# Patient Record
Sex: Female | Born: 1985 | Race: Black or African American | Hispanic: No | Marital: Single | Smoking: Current every day smoker
Health system: Southern US, Community
[De-identification: ages and names within clinical notes are randomized; demographics above are authoritative.]

## PROBLEM LIST (undated history)

## (undated) DIAGNOSIS — Z86718 Personal history of other venous thrombosis and embolism: Secondary | ICD-10-CM

## (undated) HISTORY — PX: ABDOMINAL SURGERY: SHX537

## (undated) HISTORY — PX: CHEST TUBE INSERTION: SHX231

## (undated) HISTORY — PX: LUNG SURGERY: SHX703

---

## 2009-03-03 ENCOUNTER — Ambulatory Visit: Payer: Self-pay | Admitting: Cardiology

## 2009-07-02 ENCOUNTER — Ambulatory Visit (HOSPITAL_COMMUNITY): Admission: RE | Admit: 2009-07-02 | Discharge: 2009-07-02 | Payer: Self-pay | Admitting: Internal Medicine

## 2009-07-09 ENCOUNTER — Ambulatory Visit (HOSPITAL_COMMUNITY): Admission: RE | Admit: 2009-07-09 | Discharge: 2009-07-09 | Payer: Self-pay | Admitting: Internal Medicine

## 2009-07-20 ENCOUNTER — Ambulatory Visit (HOSPITAL_COMMUNITY): Admission: RE | Admit: 2009-07-20 | Discharge: 2009-07-20 | Payer: Self-pay | Admitting: Internal Medicine

## 2010-05-22 ENCOUNTER — Emergency Department (HOSPITAL_COMMUNITY)
Admission: EM | Admit: 2010-05-22 | Discharge: 2010-05-22 | Disposition: A | Discharge status: Home / Self Care | Payer: Medicaid Other | Attending: Emergency Medicine | Admitting: Emergency Medicine

## 2010-05-22 DIAGNOSIS — N390 Urinary tract infection, site not specified: Secondary | ICD-10-CM | POA: Insufficient documentation

## 2010-05-22 DIAGNOSIS — K5289 Other specified noninfective gastroenteritis and colitis: Secondary | ICD-10-CM | POA: Insufficient documentation

## 2010-05-22 DIAGNOSIS — F17200 Nicotine dependence, unspecified, uncomplicated: Secondary | ICD-10-CM | POA: Insufficient documentation

## 2010-05-22 LAB — URINALYSIS, ROUTINE W REFLEX MICROSCOPIC
Glucose, UA: NEGATIVE mg/dL
Nitrite: NEGATIVE
Specific Gravity, Urine: 1.01 (ref 1.005–1.030)

## 2010-05-22 LAB — URINE MICROSCOPIC-ADD ON

## 2010-05-25 LAB — URINE CULTURE

## 2011-01-23 ENCOUNTER — Emergency Department (HOSPITAL_COMMUNITY)
Admission: EM | Admit: 2011-01-23 | Discharge: 2011-01-23 | Disposition: A | Discharge status: Home / Self Care | Payer: Medicaid Other | Attending: Emergency Medicine | Admitting: Emergency Medicine

## 2011-01-23 DIAGNOSIS — F17200 Nicotine dependence, unspecified, uncomplicated: Secondary | ICD-10-CM | POA: Insufficient documentation

## 2011-01-23 DIAGNOSIS — J111 Influenza due to unidentified influenza virus with other respiratory manifestations: Secondary | ICD-10-CM | POA: Insufficient documentation

## 2011-01-23 MED ORDER — KETOROLAC TROMETHAMINE 60 MG/2ML IM SOLN
60.0000 mg | Freq: Once | INTRAMUSCULAR | Status: AC
  Administered 2011-01-23: 60 mg via INTRAMUSCULAR
  Filled 2011-01-23: qty 2

## 2011-01-23 MED ORDER — ACETAMINOPHEN 500 MG PO TABS
1000.0000 mg | ORAL_TABLET | Freq: Once | ORAL | Status: AC
  Administered 2011-01-23: 1000 mg via ORAL
  Filled 2011-01-23: qty 2

## 2011-01-23 MED ORDER — OSELTAMIVIR PHOSPHATE 75 MG PO CAPS: 75.0000 mg | ORAL_CAPSULE | Freq: Two times a day (BID) | ORAL | Status: AC

## 2011-01-23 NOTE — ED Notes (Signed)
Pt presents with cough, generalized body aches and headache since last night. NAD at this time.

## 2011-01-24 NOTE — ED Provider Notes (Signed)
History     CSN: [id number] Arrival date & time: 01/23/2011 12:50 PM   First MD Initiated Contact with Patient 01/23/11 1323      Chief Complaint  Patient presents with  . Generalized Body Aches  . Cough  . Headache    (Consider location/radiation/quality/duration/timing/severity/associated sxs/prior treatment) Patient is a 25 y.o. female presenting with cough and headaches. The history is provided by the patient.  Cough This is a new problem. The current episode started 12 to 24 hours ago. The problem occurs constantly. The problem has not changed since onset.The cough is non-productive. The maximum temperature recorded prior to her arrival was 101 to 101.9 F. The fever has been present for less than 1 day. Associated symptoms include chills, sweats, headaches and myalgias. Pertinent negatives include no chest pain, no sore throat and no shortness of breath. She has tried nothing for the symptoms. She is a smoker. Her past medical history does not include pneumonia or asthma.  Headache  Pertinent negatives include no fever, no shortness of breath and no nausea.    History reviewed. No pertinent past medical history.  History reviewed. No pertinent past surgical history.  No family history on file.  History  Substance Use Topics  . Smoking status: Current Everyday Smoker -- 0.5 packs/day  . Smokeless tobacco: Not on file  . Alcohol Use: Yes     occ    OB History    Grav Para Term Preterm Abortions TAB SAB Ect Mult Living                  Review of Systems  Constitutional: Positive for chills. Negative for fever.  HENT: Negative for congestion, sore throat and neck pain.   Eyes: Negative.   Respiratory: Positive for cough. Negative for chest tightness and shortness of breath.   Cardiovascular: Negative for chest pain.  Gastrointestinal: Negative for nausea and abdominal pain.  Genitourinary: Negative.   Musculoskeletal: Positive for myalgias. Negative for joint  swelling and arthralgias.  Skin: Negative.  Negative for rash and wound.  Neurological: Positive for headaches. Negative for dizziness, weakness, light-headedness and numbness.  Hematological: Negative.   Psychiatric/Behavioral: Negative.     Allergies  Other  Home Medications   Current Outpatient Rx  Name Route Sig Dispense Refill  . OSELTAMIVIR PHOSPHATE 75 MG PO CAPS Oral Take 1 capsule (75 mg total) by mouth 2 (two) times daily. 10 capsule 0    BP 112/56  Pulse 111  Temp(Src) 101.4 F (38.6 C) (Oral)  Resp 18  Ht 5\' 5"  (1.651 m)  Wt 150 lb (68.04 kg)  BMI 24.96 kg/m2  SpO2 98%  LMP 01/16/2011  Physical Exam  Nursing note and vitals reviewed. Constitutional: She is oriented to person, place, and time. She appears well-developed and well-nourished.       Appears uncomfortable.  HENT:  Head: Normocephalic and atraumatic.  Eyes: Conjunctivae are normal.  Neck: Normal range of motion.  Cardiovascular: Normal rate, regular rhythm, normal heart sounds and intact distal pulses.   Pulmonary/Chest: Effort normal and breath sounds normal. She has no wheezes. She has no rales. She exhibits no tenderness.  Abdominal: Soft. Bowel sounds are normal. There is no tenderness.  Musculoskeletal: Normal range of motion.  Neurological: She is alert and oriented to person, place, and time.  Skin: Skin is warm and dry.  Psychiatric: She has a normal mood and affect.    ED Course  Procedures (including critical care time)  Labs Reviewed -  No data to display No results found.   1. Influenza    Toradol 60mg  injection given for myalgia,  Tylenol for fever tx.   MDM  Influenza.  No acute distress.  Normal respiratory exam.  The patient appears reasonably screened and/or stabilized for discharge and I doubt any other medical condition or other [facility] requiring further screening, evaluation, or treatment in the ED at this time prior to discharge.         Candis Musa,  PA 01/24/11 2123

## 2011-01-25 NOTE — ED Provider Notes (Signed)
Medical screening examination/treatment/procedure(s) were performed by non-physician practitioner and as supervising physician I was immediately available for consultation/collaboration.   Shelda Jakes, MD 01/25/11 [phone]

## 2011-09-24 ENCOUNTER — Encounter (HOSPITAL_COMMUNITY): Payer: Self-pay | Admitting: *Deleted

## 2011-09-24 ENCOUNTER — Emergency Department (HOSPITAL_COMMUNITY): Payer: Self-pay

## 2011-09-24 ENCOUNTER — Emergency Department (HOSPITAL_COMMUNITY)
Admission: EM | Admit: 2011-09-24 | Discharge: 2011-09-24 | Disposition: A | Discharge status: Home / Self Care | Payer: Self-pay | Attending: Emergency Medicine | Admitting: Emergency Medicine

## 2011-09-24 DIAGNOSIS — IMO0002 Reserved for concepts with insufficient information to code with codable children: Secondary | ICD-10-CM | POA: Insufficient documentation

## 2011-09-24 DIAGNOSIS — M25539 Pain in unspecified wrist: Secondary | ICD-10-CM | POA: Insufficient documentation

## 2011-09-24 DIAGNOSIS — S41109A Unspecified open wound of unspecified upper arm, initial encounter: Secondary | ICD-10-CM | POA: Insufficient documentation

## 2011-09-24 MED ORDER — LIDOCAINE-EPINEPHRINE (PF) 1 %-1:200000 IJ SOLN
INTRAMUSCULAR | Status: AC
  Filled 2011-09-24: qty 10

## 2011-09-24 MED ORDER — TETANUS-DIPHTH-ACELL PERTUSSIS 5-2.5-18.5 LF-MCG/0.5 IM SUSP
0.5000 mL | Freq: Once | INTRAMUSCULAR | Status: AC
  Administered 2011-09-24: 0.5 mL via INTRAMUSCULAR
  Filled 2011-09-24: qty 0.5

## 2011-09-24 MED ORDER — IBUPROFEN 800 MG PO TABS
800.0000 mg | ORAL_TABLET | Freq: Once | ORAL | Status: AC
  Administered 2011-09-24: 800 mg via ORAL
  Filled 2011-09-24: qty 1
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
## 2011-09-24 NOTE — ED Notes (Signed)
Pt has laceration to R forearm; bleeding controlled; R arm sore and slightly swollen; Positive pulses ano no deficits with ROM noted

## 2014-03-23 IMAGING — CR DG FOREARM 2V*R*
1 series · 1 of 1 positions shown · non-contrast
Comparison: Contemporaneous wrist radiographs

CLINICAL DATA: Right anterior forearm laceration and pain

RIGHT FOREARM - 2 VIEW

[view not recorded]
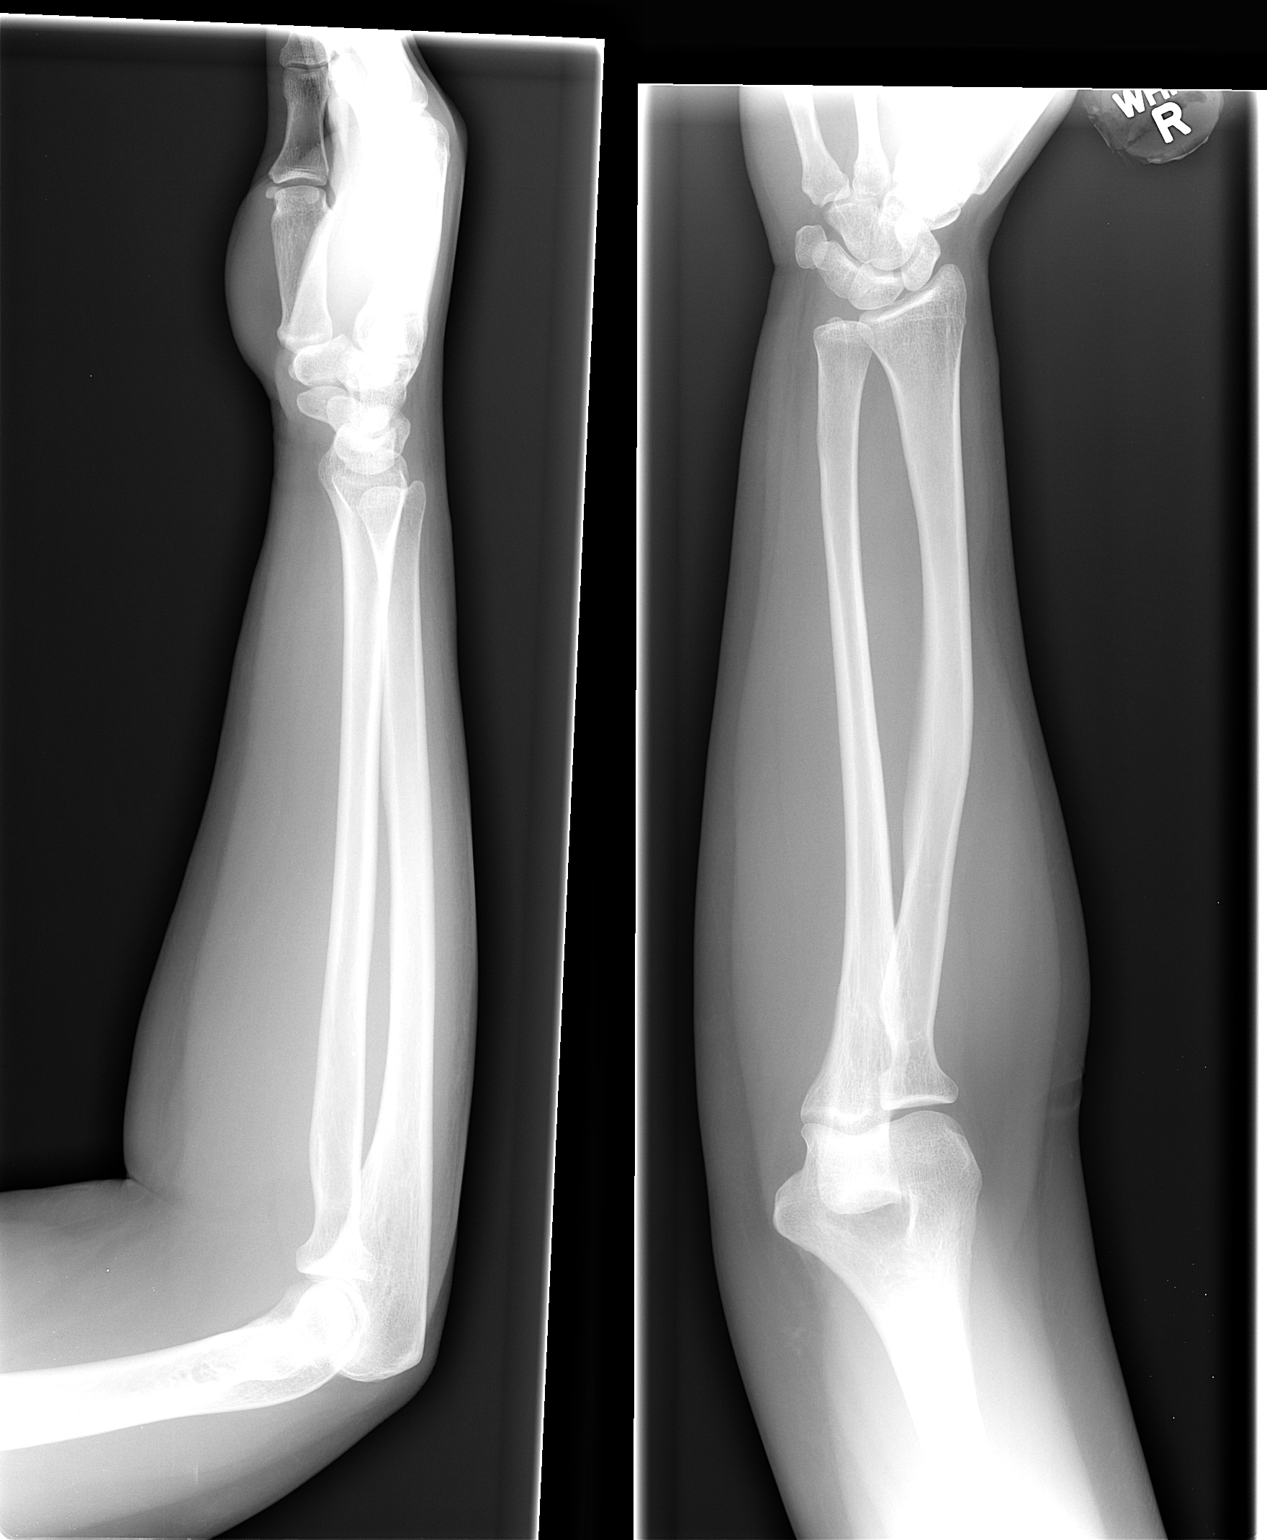

[1 of 1 positions shown; findings below may reference images not displayed]

FINDINGS: No displaced fracture or dislocation of the forearm.  No
radiopaque foreign body.
IMPRESSION: No acute osseous abnormality of the right forearm identified.

## 2014-05-27 ENCOUNTER — Other Ambulatory Visit (HOSPITAL_COMMUNITY): Payer: Self-pay | Admitting: Nurse Practitioner

## 2014-05-27 DIAGNOSIS — Z30431 Encounter for routine checking of intrauterine contraceptive device: Secondary | ICD-10-CM

## 2014-05-30 ENCOUNTER — Other Ambulatory Visit (HOSPITAL_COMMUNITY): Payer: Self-pay | Admitting: Nurse Practitioner

## 2014-05-30 DIAGNOSIS — Z30431 Encounter for routine checking of intrauterine contraceptive device: Secondary | ICD-10-CM

## 2014-06-04 ENCOUNTER — Ambulatory Visit (HOSPITAL_COMMUNITY): Admission: RE | Admit: 2014-06-04 | Payer: Medicaid Other | Source: Ambulatory Visit

## 2014-06-04 ENCOUNTER — Other Ambulatory Visit (HOSPITAL_COMMUNITY): Payer: Medicaid Other

## 2014-06-04 ENCOUNTER — Ambulatory Visit (HOSPITAL_COMMUNITY): Payer: Medicaid Other

## 2017-06-15 ENCOUNTER — Encounter (HOSPITAL_COMMUNITY): Payer: Self-pay | Admitting: *Deleted

## 2017-06-15 ENCOUNTER — Other Ambulatory Visit: Payer: Self-pay

## 2017-06-15 ENCOUNTER — Emergency Department (HOSPITAL_COMMUNITY)
Admission: EM | Admit: 2017-06-15 | Discharge: 2017-06-16 | Disposition: A | Discharge status: Home / Self Care | Payer: Self-pay | Attending: Emergency Medicine | Admitting: Emergency Medicine

## 2017-06-15 DIAGNOSIS — M791 Myalgia, unspecified site: Secondary | ICD-10-CM | POA: Insufficient documentation

## 2017-06-15 DIAGNOSIS — E876 Hypokalemia: Secondary | ICD-10-CM

## 2017-06-15 DIAGNOSIS — M6281 Muscle weakness (generalized): Secondary | ICD-10-CM | POA: Insufficient documentation

## 2017-06-15 DIAGNOSIS — R52 Pain, unspecified: Secondary | ICD-10-CM

## 2017-06-15 DIAGNOSIS — R531 Weakness: Secondary | ICD-10-CM

## 2017-06-15 DIAGNOSIS — F17210 Nicotine dependence, cigarettes, uncomplicated: Secondary | ICD-10-CM | POA: Insufficient documentation

## 2017-06-15 LAB — CBC WITH DIFFERENTIAL/PLATELET
Basophils Absolute: 0 10*3/uL (ref 0.0–0.1)
Basophils Relative: 0 %
Eosinophils Absolute: 0 10*3/uL (ref 0.0–0.7)
Eosinophils Relative: 0 %
HCT: 42.2 % (ref 36.0–46.0)
Hemoglobin: 14 g/dL (ref 12.0–15.0)
Lymphocytes Relative: 7 %
Lymphs Abs: 0.6 10*3/uL — ABNORMAL LOW (ref 0.7–4.0)
MCH: 30.7 pg (ref 26.0–34.0)
MCHC: 33.2 g/dL (ref 30.0–36.0)
MCV: 92.5 fL (ref 78.0–100.0)
Monocytes Absolute: 0.3 10*3/uL (ref 0.1–1.0)
Monocytes Relative: 4 %
Neutro Abs: 8 10*3/uL — ABNORMAL HIGH (ref 1.7–7.7)
Neutrophils Relative %: 89 %
Platelets: 208 10*3/uL (ref 150–400)
RBC: 4.56 MIL/uL (ref 3.87–5.11)
RDW: 12.7 % (ref 11.5–15.5)
WBC: 9 10*3/uL (ref 4.0–10.5)

## 2017-06-15 LAB — BASIC METABOLIC PANEL
Anion gap: 11 (ref 5–15)
BUN: 11 mg/dL (ref 6–20)
CO2: 23 mmol/L (ref 22–32)
Calcium: 9.3 mg/dL (ref 8.9–10.3)
Chloride: 104 mmol/L (ref 101–111)
Creatinine, Ser: 0.83 mg/dL (ref 0.44–1.00)
GFR calc Af Amer: >60 mL/min (ref >60)
GFR calc non Af Amer: >60 mL/min (ref >60)
Glucose, Bld: 122 mg/dL — ABNORMAL HIGH (ref 65–99)
Potassium: 3.3 mmol/L — ABNORMAL LOW (ref 3.5–5.1)
Sodium: 138 mmol/L (ref 135–145)

## 2017-06-15 MED ORDER — LACTATED RINGERS IV BOLUS
1000.0000 mL | Freq: Once | INTRAVENOUS | Status: AC
  Administered 2017-06-15: 1000 mL via INTRAVENOUS

## 2017-06-15 MED ORDER — KETOROLAC TROMETHAMINE 30 MG/ML IJ SOLN
15.0000 mg | Freq: Once | INTRAMUSCULAR | Status: AC
  Administered 2017-06-15: 15 mg via INTRAVENOUS
  Filled 2017-06-15: qty 1

## 2017-06-15 MED ORDER — ACETAMINOPHEN 325 MG PO TABS
650.0000 mg | ORAL_TABLET | Freq: Once | ORAL | Status: AC
  Administered 2017-06-15: 650 mg via ORAL
  Filled 2017-06-15: qty 2

## 2017-06-15 NOTE — ED Triage Notes (Signed)
Pt c/o generalized body aches that started today; pt also c/o dizziness with nausea

## 2017-06-15 NOTE — ED Provider Notes (Signed)
[facility] EMERGENCY DEPARTMENT Provider Note   CSN: [id number] Arrival date & time: 06/15/17  2203     History   Chief Complaint Chief Complaint  Patient presents with  . Generalized Body Aches    HPI Annette Morton is a 32 y.o. female.  HPI   32 year old female with generalized body aches.  Symptoms started earlier this morning.  Persistent since then.  She feels generally weak.  She was having some increased pain in her right mid to lower back.  No urinary complaints.  Denies any trauma.  Has a past history of back surgery.  This may have a drug use.  Denies it.  No vomiting or diarrhea.  No cough.  No dyspnea.  There are no active problems to display for this patient.   Past Surgical History:  Procedure Laterality Date  . ABDOMINAL SURGERY    . CHEST TUBE INSERTION    . LUNG SURGERY Right      OB History   None      Home Medications    Prior to Admission medications   Not on File    Family History History reviewed. No pertinent family history.  Social History Social History   Tobacco Use  . Smoking status: Current Every Day Smoker    Packs/day: 0.50  . Smokeless tobacco: Never Used  Substance Use Topics  . Alcohol use: Yes    Comment: occ  . Drug use: No     Allergies   Augmentin [amoxicillin-pot clavulanate] and Other   Review of Systems Review of Systems  All systems reviewed and negative, other than as noted in HPI.  Physical Exam Updated Vital Signs BP 121/72   Pulse (!) 101   Temp 99.1 F (37.3 C) (Oral)   Resp 19   Wt 79.4 kg (175 lb)   SpO2 95%   BMI 28.25 kg/m   Physical Exam  Constitutional: She appears well-developed and well-nourished. No distress.  HENT:  Head: Normocephalic and atraumatic.  Eyes: Conjunctivae are normal. Right eye exhibits no discharge. Left eye exhibits no discharge.  Neck: Neck supple.  Cardiovascular: Regular rhythm and normal heart sounds. Exam reveals no gallop and no friction rub.  No  murmur heard. Mild tachycardia  Pulmonary/Chest: Effort normal and breath sounds normal. No respiratory distress.  Abdominal: Soft. She exhibits no distension. There is no tenderness.  Genitourinary:  Genitourinary Comments: No CVA tenderness  Musculoskeletal: She exhibits no edema or tenderness.  Neurological: She is alert.  Skin: Skin is warm and dry.  Psychiatric: She has a normal mood and affect. Her behavior is normal. Thought content normal.  Nursing note and vitals reviewed.    ED Treatments / Results  Labs (all labs ordered are listed, but only abnormal results are displayed) Labs Reviewed  CBC WITH DIFFERENTIAL/PLATELET - Abnormal; Notable for the following components:      Result Value   Neutro Abs 8.0 (*)    Lymphs Abs 0.6 (*)    All other components within normal limits  BASIC METABOLIC PANEL - Abnormal; Notable for the following components:   Potassium 3.3 (*)    Glucose, Bld 122 (*)    All other components within normal limits  URINALYSIS, ROUTINE W REFLEX MICROSCOPIC  PREGNANCY, URINE    EKG None  Radiology No results found.  Procedures Procedures (including critical care time)  Medications Ordered in ED Medications  lactated ringers bolus 1,000 mL (1,000 mLs Intravenous New Bag/Given 06/15/17 2258)  acetaminophen (TYLENOL) tablet  650 mg (650 mg Oral Given 06/15/17 2257)  ketorolac (TORADOL) 30 MG/ML injection 15 mg (15 mg Intravenous Given 06/15/17 2257)     Initial Impression / Assessment and Plan / ED Course  I have reviewed the triage vital signs and the nursing notes.  Pertinent labs & imaging results that were available during my care of the patient were reviewed by me and considered in my medical decision making (see chart for details).     32 year old female with subjective fevers, generalized weakness and myalgias.  Physical exam is unremarkable aside from some mild tachycardia.  Really no focal symptoms aside from pain possibly being somewhat  worse in the right thoracic region.  She has no urinary complaints.  No respiratory complaints.  She sounds clear on exam.  No increased work of breathing oxygen saturations are fine on room air.  Basic labs unremarkable.  Possibly viral illness. I doubt emergent process. UA is pending. Care signed out to Dr Charline Bills Lynelle Doctor.   Final Clinical Impressions(s) / ED Diagnoses   Final diagnoses:  Body aches  Generalized weakness    ED Discharge Orders    None       Raeford Razor, MD 06/16/17 [phone]

## 2017-06-16 LAB — URINALYSIS, ROUTINE W REFLEX MICROSCOPIC
Bilirubin Urine: NEGATIVE
Glucose, UA: NEGATIVE mg/dL
Ketones, ur: NEGATIVE mg/dL
Leukocytes, UA: NEGATIVE
Nitrite: NEGATIVE
Protein, ur: NEGATIVE mg/dL
Specific Gravity, Urine: 1.003 — ABNORMAL LOW (ref 1.005–1.030)
pH: 5 (ref 5.0–8.0)

## 2017-06-16 LAB — PREGNANCY, URINE: Preg Test, Ur: NEGATIVE

## 2017-06-16 MED ORDER — ONDANSETRON HCL 4 MG PO TABS: 4.0000 mg | ORAL_TABLET | Freq: Three times a day (TID) | ORAL | 0 refills | Status: DC | PRN

## 2017-06-16 MED ORDER — POTASSIUM CHLORIDE CRYS ER 20 MEQ PO TBCR: 20.0000 meq | EXTENDED_RELEASE_TABLET | Freq: Two times a day (BID) | ORAL | 0 refills | Status: DC

## 2017-06-16 NOTE — ED Provider Notes (Signed)
Patient was left at change of shift to check her urinalysis.  When I talked to the patient she states she feels "about the same".  She states about 9 PM she had nausea without vomiting or diarrhea.  She states she did have diarrhea however the night before.  She denies any abdominal pain or URI symptoms.  She states she has an IUD.  We discussed her test results which were all normal and consistent with a viral illness.  She was discharged home with Zofran to use for nausea as needed, she states she currently is not nauseated.  She should drink plenty of fluids and if things change she can be rechecked.  Results for orders placed or performed during the hospital encounter of 06/15/17  CBC with Differential  Result Value Ref Range   WBC 9.0 4.0 - 10.5 K/uL   RBC 4.56 3.87 - 5.11 MIL/uL   Hemoglobin 14.0 12.0 - 15.0 g/dL   HCT 14.7 82.9 - 56.2 %   MCV 92.5 78.0 - 100.0 fL   MCH 30.7 26.0 - 34.0 pg   MCHC 33.2 30.0 - 36.0 g/dL   RDW 13.0 86.5 - 78.4 %   Platelets 208 150 - 400 K/uL   Neutrophils Relative % 89 %   Neutro Abs 8.0 (H) 1.7 - 7.7 K/uL   Lymphocytes Relative 7 %   Lymphs Abs 0.6 (L) 0.7 - 4.0 K/uL   Monocytes Relative 4 %   Monocytes Absolute 0.3 0.1 - 1.0 K/uL   Eosinophils Relative 0 %   Eosinophils Absolute 0.0 0.0 - 0.7 K/uL   Basophils Relative 0 %   Basophils Absolute 0.0 0.0 - 0.1 K/uL  Basic metabolic panel  Result Value Ref Range   Sodium 138 135 - 145 mmol/L   Potassium 3.3 (L) 3.5 - 5.1 mmol/L   Chloride 104 101 - 111 mmol/L   CO2 23 22 - 32 mmol/L   Glucose, Bld 122 (H) 65 - 99 mg/dL   BUN 11 6 - 20 mg/dL   Creatinine, Ser 6.96 0.44 - 1.00 mg/dL   Calcium 9.3 8.9 - 29.5 mg/dL   GFR calc non Af Amer >60 >60 mL/min   GFR calc Af Amer >60 >60 mL/min   Anion gap 11 5 - 15  Urinalysis, Routine w reflex microscopic  Result Value Ref Range   Color, Urine YELLOW YELLOW   APPearance CLOUDY (A) CLEAR   Specific Gravity, Urine 1.003 (L) 1.005 - 1.030   pH 5.0 5.0 -  8.0   Glucose, UA NEGATIVE NEGATIVE mg/dL   Hgb urine dipstick SMALL (A) NEGATIVE   Bilirubin Urine NEGATIVE NEGATIVE   Ketones, ur NEGATIVE NEGATIVE mg/dL   Protein, ur NEGATIVE NEGATIVE mg/dL   Nitrite NEGATIVE NEGATIVE   Leukocytes, UA NEGATIVE NEGATIVE   RBC / HPF 0-5 0 - 5 RBC/hpf   WBC, UA 0-5 0 - 5 WBC/hpf   Bacteria, UA FEW (A) NONE SEEN   Squamous Epithelial / LPF 21-50 0 - 5  Pregnancy, urine  Result Value Ref Range   Preg Test, Ur NEGATIVE NEGATIVE   Laboratory interpretation all normal except mild hypokalemia  Diagnoses that have been ruled out:  None  Diagnoses that are still under consideration:  None  Final diagnoses:  Body aches  Generalized weakness  Hypokalemia    Discharge Medication List as of 06/16/2017  2:08 AM    START taking these medications   Details  ondansetron (ZOFRAN) 4 MG tablet Take 1 tablet (  4 mg total) by mouth every 8 (eight) hours as needed., Starting Fri 06/16/2017, Print    potassium chloride SA (K-DUR,KLOR-CON) 20 MEQ tablet Take 1 tablet (20 mEq total) by mouth 2 (two) times daily., Starting Fri 06/16/2017, Print        Plan discharge  Devoria Albe, MD, Concha Pyo, MD 06/16/17 [phone]

## 2017-06-16 NOTE — Discharge Instructions (Addendum)
Drink plenty of fluids. Use the zofran for nausea or vomiting. Take ibuprofen 600 mg + acetaminophen 650 mg every 6 hrs for body aches. Recheck if you get worse.

## 2018-11-29 ENCOUNTER — Other Ambulatory Visit: Payer: Self-pay

## 2018-11-29 DIAGNOSIS — Z20822 Contact with and (suspected) exposure to covid-19: Secondary | ICD-10-CM

## 2018-11-30 LAB — NOVEL CORONAVIRUS, NAA: SARS-CoV-2, NAA: NOT DETECTED

## 2019-02-12 ENCOUNTER — Ambulatory Visit: Payer: Self-pay | Attending: Internal Medicine

## 2019-05-15 ENCOUNTER — Emergency Department (HOSPITAL_COMMUNITY): Payer: Self-pay

## 2019-05-15 ENCOUNTER — Emergency Department (HOSPITAL_COMMUNITY)
Admission: EM | Admit: 2019-05-15 | Discharge: 2019-05-15 | Disposition: A | Discharge status: Home / Self Care | Payer: Self-pay | Attending: Emergency Medicine | Admitting: Emergency Medicine

## 2019-05-15 ENCOUNTER — Encounter (HOSPITAL_COMMUNITY): Payer: Self-pay | Admitting: *Deleted

## 2019-05-15 ENCOUNTER — Other Ambulatory Visit: Payer: Self-pay

## 2019-05-15 DIAGNOSIS — M79601 Pain in right arm: Secondary | ICD-10-CM | POA: Insufficient documentation

## 2019-05-15 DIAGNOSIS — N760 Acute vaginitis: Secondary | ICD-10-CM | POA: Insufficient documentation

## 2019-05-15 DIAGNOSIS — B9689 Other specified bacterial agents as the cause of diseases classified elsewhere: Secondary | ICD-10-CM

## 2019-05-15 DIAGNOSIS — Z79899 Other long term (current) drug therapy: Secondary | ICD-10-CM | POA: Insufficient documentation

## 2019-05-15 DIAGNOSIS — F17210 Nicotine dependence, cigarettes, uncomplicated: Secondary | ICD-10-CM | POA: Insufficient documentation

## 2019-05-15 HISTORY — DX: Personal history of other venous thrombosis and embolism: Z86.718

## 2019-05-15 LAB — WET PREP, GENITAL
Sperm: NONE SEEN
Trich, Wet Prep: NONE SEEN
Yeast Wet Prep HPF POC: NONE SEEN

## 2019-05-15 MED ORDER — METRONIDAZOLE 500 MG PO TABS: 500.0000 mg | ORAL_TABLET | Freq: Two times a day (BID) | ORAL | 0 refills | Status: DC
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
## 2019-05-15 NOTE — Discharge Instructions (Signed)
Follow-up with Dr. Romeo Apple if your ultrasound of your arm is negative

## 2019-05-17 LAB — GC/CHLAMYDIA PROBE AMP (~~LOC~~) NOT AT ARMC
Chlamydia: NEGATIVE
Neisseria Gonorrhea: NEGATIVE

## 2019-05-17 NOTE — ED Provider Notes (Signed)
[facility] EMERGENCY DEPARTMENT Provider Note   CSN: [id number] Arrival date & time: 05/15/19  1610     History Chief Complaint  Patient presents with  . Arm Pain    Annette Morton is a 34 y.o. female.  Patient complains of pain in her right proximal humerus.  Patient also complains of vaginal discharge  The history is provided by the patient. No language interpreter was used.  Arm Pain This is a new problem. The current episode started 12 to 24 hours ago. The problem occurs constantly. The problem has not changed since onset.Pertinent negatives include no chest pain, no abdominal pain and no headaches. Nothing aggravates the symptoms. Nothing relieves the symptoms. She has tried nothing for the symptoms. The treatment provided no relief.       Past Medical History:  Diagnosis Date  . Hx of blood clots     There are no problems to display for this patient.   Past Surgical History:  Procedure Laterality Date  . ABDOMINAL SURGERY    . CHEST TUBE INSERTION    . LUNG SURGERY Right      OB History   No obstetric history on file.     No family history on file.  Social History   Tobacco Use  . Smoking status: Current Every Day Smoker    Packs/day: 1.00  . Smokeless tobacco: Never Used  Substance Use Topics  . Alcohol use: Yes    Comment: occ  . Drug use: No    Home Medications Prior to Admission medications   Medication Sig Start Date End Date Taking? Authorizing Provider  metroNIDAZOLE (FLAGYL) 500 MG tablet Take 1 tablet (500 mg total) by mouth 2 (two) times daily. One po bid x 7 days 05/15/19   Bethann Berkshire, MD  ondansetron (ZOFRAN) 4 MG tablet Take 1 tablet (4 mg total) by mouth every 8 (eight) hours as needed. 06/16/17   Devoria Albe, MD  potassium chloride SA (K-DUR,KLOR-CON) 20 MEQ tablet Take 1 tablet (20 mEq total) by mouth 2 (two) times daily. 06/16/17   Devoria Albe, MD    Allergies    Augmentin [amoxicillin-pot clavulanate] and Other  Review of  Systems   Review of Systems  Constitutional: Negative for appetite change and fatigue.  HENT: Negative for congestion, ear discharge and sinus pressure.   Eyes: Negative for discharge.  Respiratory: Negative for cough.   Cardiovascular: Negative for chest pain.  Gastrointestinal: Negative for abdominal pain and diarrhea.  Genitourinary: Negative for frequency and hematuria.       Vaginal discharge  Musculoskeletal: Negative for back pain.       Right upper arm pain  Skin: Negative for rash.  Neurological: Negative for seizures and headaches.  Psychiatric/Behavioral: Negative for hallucinations.    Physical Exam Updated Vital Signs BP (!) 127/92   Pulse 72   Temp 98.6 F (37 C) (Oral)   Resp 18   Ht 5\' 5"  (1.651 m)   Wt 86.2 kg   SpO2 98%   BMI 31.62 kg/m   Physical Exam Vitals and nursing note reviewed.  Constitutional:      Appearance: She is well-developed.  HENT:     Head: Normocephalic.     Nose: Nose normal.  Eyes:     General: No scleral icterus.    Conjunctiva/sclera: Conjunctivae normal.  Neck:     Thyroid: No thyromegaly.  Cardiovascular:     Rate and Rhythm: Normal rate and regular rhythm.  Heart sounds: No murmur. No friction rub. No gallop.   Pulmonary:     Breath sounds: No stridor. No wheezing or rales.  Chest:     Chest wall: No tenderness.  Abdominal:     General: There is no distension.     Tenderness: There is no abdominal tenderness. There is no rebound.  Genitourinary:    Comments: Very mild discharge no tenderness or masses Musculoskeletal:        General: Normal range of motion.     Cervical back: Neck supple.     Comments: Mild tenderness to proximal humerus  Lymphadenopathy:     Cervical: No cervical adenopathy.  Skin:    Findings: No erythema or rash.  Neurological:     Mental Status: She is alert and oriented to person, place, and time.     Motor: No abnormal muscle tone.     Coordination: Coordination normal.    Psychiatric:        Behavior: Behavior normal.     ED Results / Procedures / Treatments   Labs (all labs ordered are listed, but only abnormal results are displayed) Labs Reviewed  WET PREP, GENITAL - Abnormal; Notable for the following components:      Result Value   Clue Cells Wet Prep HPF POC PRESENT (*)    WBC, Wet Prep HPF POC RARE (*)    All other components within normal limits  GC/CHLAMYDIA PROBE AMP ([place]) NOT AT [facility]    EKG None  Radiology DG Humerus Right  Result Date: 05/15/2019 CLINICAL DATA:  Right arm pain.  No known injury. EXAM: RIGHT HUMERUS - 2+ VIEW COMPARISON:  None. FINDINGS: There is no evidence of fracture or other focal bone lesions. Soft tissues are unremarkable. IMPRESSION: Negative. Electronically Signed   By: Ulyses Jarred M.D.   On: 05/15/2019 20:34    Procedures Procedures (including critical care time)  Medications Ordered in ED Medications - No data to display  ED Course  I have reviewed the triage vital signs and the nursing notes.  Pertinent labs & imaging results that were available during my care of the patient were reviewed by me and considered in my medical decision making (see chart for details).    MDM Rules/Calculators/A&P                     X-ray unremarkable in right arm.  She will be scheduled for an ultrasound to make sure she does not have a blood clot.  I doubt that she does.  Patient is also placed on Flagyl for BV  Final Clinical Impression(s) / ED Diagnoses Final diagnoses:  Right arm pain  BV (bacterial vaginosis)    Rx / DC Orders ED Discharge Orders         Ordered    metroNIDAZOLE (FLAGYL) 500 MG tablet  2 times daily     05/15/19 2149    US Venous Img Upper Uni Right     05/15/19 2149           Milton Ferguson, MD 05/17/19 1057

## 2020-10-22 DIAGNOSIS — Z0000 Encounter for general adult medical examination without abnormal findings: Secondary | ICD-10-CM | POA: Diagnosis not present

## 2021-06-04 DIAGNOSIS — M62838 Other muscle spasm: Secondary | ICD-10-CM | POA: Diagnosis not present

## 2021-07-06 DIAGNOSIS — Z20822 Contact with and (suspected) exposure to covid-19: Secondary | ICD-10-CM | POA: Diagnosis not present

## 2021-08-24 DIAGNOSIS — Z113 Encounter for screening for infections with a predominantly sexual mode of transmission: Secondary | ICD-10-CM | POA: Diagnosis not present

## 2021-10-26 DIAGNOSIS — Z01419 Encounter for gynecological examination (general) (routine) without abnormal findings: Secondary | ICD-10-CM | POA: Diagnosis not present

## 2021-10-26 DIAGNOSIS — Z113 Encounter for screening for infections with a predominantly sexual mode of transmission: Secondary | ICD-10-CM | POA: Diagnosis not present

## 2021-10-26 DIAGNOSIS — Z0000 Encounter for general adult medical examination without abnormal findings: Secondary | ICD-10-CM | POA: Diagnosis not present

## 2021-11-09 DIAGNOSIS — Z20822 Contact with and (suspected) exposure to covid-19: Secondary | ICD-10-CM | POA: Diagnosis not present

## 2021-11-11 IMAGING — DX DG HUMERUS 2V *R*
2 series · 2 of 2 positions shown · non-contrast
Comparison: None.

CLINICAL DATA: Right arm pain.  No known injury.

EXAM:
RIGHT HUMERUS - 2+ VIEW

[humerus ap]
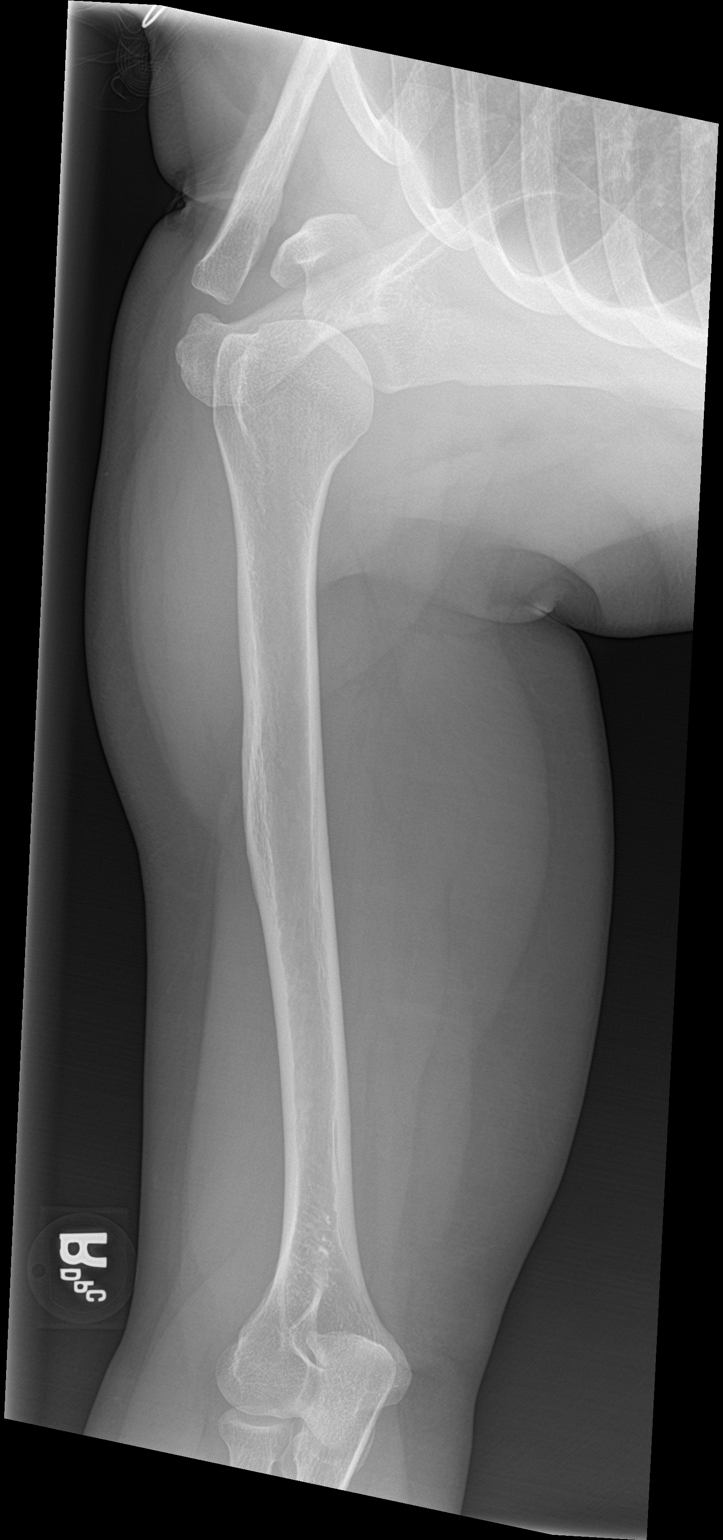

[humerus lat]
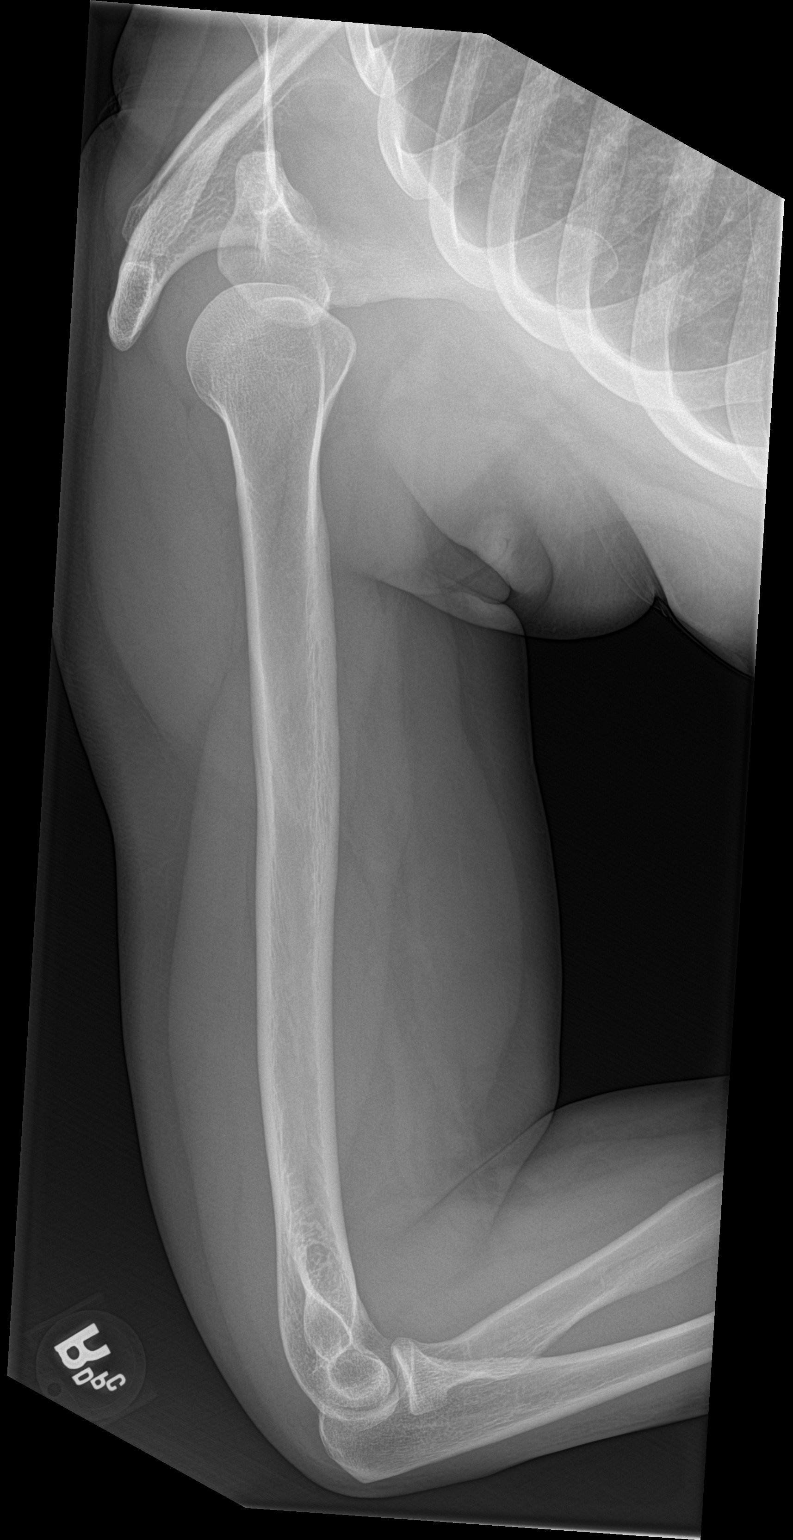

[2 of 2 positions shown; findings below may reference images not displayed]

FINDINGS: There is no evidence of fracture or other focal bone lesions. Soft
tissues are unremarkable.
IMPRESSION: Negative.

## 2021-11-23 DIAGNOSIS — F419 Anxiety disorder, unspecified: Secondary | ICD-10-CM | POA: Diagnosis not present

## 2021-12-28 DIAGNOSIS — F419 Anxiety disorder, unspecified: Secondary | ICD-10-CM | POA: Diagnosis not present

## 2022-01-16 ENCOUNTER — Emergency Department (HOSPITAL_COMMUNITY): Payer: Medicaid Other

## 2022-01-16 ENCOUNTER — Encounter (HOSPITAL_COMMUNITY): Payer: Self-pay

## 2022-01-16 ENCOUNTER — Other Ambulatory Visit: Payer: Self-pay

## 2022-01-16 ENCOUNTER — Emergency Department (HOSPITAL_COMMUNITY)
Admission: EM | Admit: 2022-01-16 | Discharge: 2022-01-17 | Disposition: A | Discharge status: Home / Self Care | Payer: Medicaid Other | Attending: Emergency Medicine | Admitting: Emergency Medicine

## 2022-01-16 DIAGNOSIS — R519 Headache, unspecified: Secondary | ICD-10-CM | POA: Diagnosis not present

## 2022-01-16 LAB — BASIC METABOLIC PANEL
Anion gap: 11 (ref 5–15)
BUN: 16 mg/dL (ref 6–20)
CO2: 20 mmol/L — ABNORMAL LOW (ref 22–32)
Calcium: 8.9 mg/dL (ref 8.9–10.3)
Chloride: 110 mmol/L (ref 98–111)
Creatinine, Ser: 1.06 mg/dL — ABNORMAL HIGH (ref 0.44–1.00)
GFR, Estimated: >60 mL/min (ref >60)
Glucose, Bld: 89 mg/dL (ref 70–99)
Potassium: 3.5 mmol/L (ref 3.5–5.1)
Sodium: 141 mmol/L (ref 135–145)

## 2022-01-16 LAB — CBC WITH DIFFERENTIAL/PLATELET
Abs Immature Granulocytes: 0.03 10*3/uL (ref 0.00–0.07)
Basophils Absolute: 0 10*3/uL (ref 0.0–0.1)
Basophils Relative: 0 %
Eosinophils Absolute: 0.2 10*3/uL (ref 0.0–0.5)
Eosinophils Relative: 2 %
HCT: 43.8 % (ref 36.0–46.0)
Hemoglobin: 13.9 g/dL (ref 12.0–15.0)
Immature Granulocytes: 0 %
Lymphocytes Relative: 31 %
Lymphs Abs: 2.9 10*3/uL (ref 0.7–4.0)
MCH: 31.9 pg (ref 26.0–34.0)
MCHC: 31.7 g/dL (ref 30.0–36.0)
MCV: 100.5 fL — ABNORMAL HIGH (ref 80.0–100.0)
Monocytes Absolute: 0.5 10*3/uL (ref 0.1–1.0)
Monocytes Relative: 6 %
Neutro Abs: 5.9 10*3/uL (ref 1.7–7.7)
Neutrophils Relative %: 61 %
Platelets: 273 10*3/uL (ref 150–400)
RBC: 4.36 MIL/uL (ref 3.87–5.11)
RDW: 14 % (ref 11.5–15.5)
WBC: 9.6 10*3/uL (ref 4.0–10.5)
nRBC: 0 % (ref 0.0–0.2)

## 2022-01-16 LAB — URINALYSIS, ROUTINE W REFLEX MICROSCOPIC
Bilirubin Urine: NEGATIVE
Glucose, UA: NEGATIVE mg/dL
Hgb urine dipstick: NEGATIVE
Ketones, ur: NEGATIVE mg/dL
Leukocytes,Ua: NEGATIVE
Nitrite: NEGATIVE
Protein, ur: NEGATIVE mg/dL
Specific Gravity, Urine: 1.011 (ref 1.005–1.030)
pH: 5 (ref 5.0–8.0)

## 2022-01-16 LAB — PREGNANCY, URINE: Preg Test, Ur: NEGATIVE

## 2022-01-16 MED ORDER — SODIUM CHLORIDE 0.9 % IV BOLUS
1000.0000 mL | Freq: Once | INTRAVENOUS | Status: AC
  Administered 2022-01-16: 1000 mL via INTRAVENOUS

## 2022-01-16 MED ORDER — KETOROLAC TROMETHAMINE 30 MG/ML IJ SOLN
30.0000 mg | Freq: Once | INTRAMUSCULAR | Status: AC
  Administered 2022-01-16: 30 mg via INTRAVENOUS
  Filled 2022-01-16: qty 1

## 2022-01-16 MED ORDER — DIPHENHYDRAMINE HCL 50 MG/ML IJ SOLN
25.0000 mg | Freq: Once | INTRAMUSCULAR | Status: AC
  Administered 2022-01-16: 25 mg via INTRAVENOUS
  Filled 2022-01-16: qty 1

## 2022-01-16 MED ORDER — DEXAMETHASONE SODIUM PHOSPHATE 10 MG/ML IJ SOLN
10.0000 mg | Freq: Once | INTRAMUSCULAR | Status: AC
  Administered 2022-01-16: 10 mg via INTRAVENOUS
  Filled 2022-01-16: qty 1

## 2022-01-16 MED ORDER — PROCHLORPERAZINE EDISYLATE 10 MG/2ML IJ SOLN
10.0000 mg | Freq: Once | INTRAMUSCULAR | Status: AC
  Administered 2022-01-16: 10 mg via INTRAVENOUS
  Filled 2022-01-16: qty 2

## 2022-01-16 NOTE — ED Triage Notes (Signed)
"  Sharp pains throughout my head that comes and goes and makes my vision blurry and I get dizzy since Thursday or Friday. I called my PCP Westley Foots today and he told me based on my symptoms to come to the hospital" per pt

## 2022-01-16 NOTE — ED Provider Notes (Signed)
[facility] EMERGENCY DEPARTMENT Provider Note   CSN: [id number] Arrival date & time: 01/16/22  1448     History {Add pertinent medical, surgical, social history, OB history to HPI:1} Chief Complaint  Patient presents with   Headache   Dizziness    Annette Morton is a 36 y.o. female.  The history is provided by the patient.  Headache Associated symptoms: dizziness   Dizziness Associated symptoms: headaches   She had onset 3 days ago of headache with associated blurred vision.  She describes sharp pain shooting throughout her head on both sides.  Pain is pretty much constant, but does let her go to sleep and pain does not wake her up.  She has tried a variety of over-the-counter medications including BC powder, ibuprofen, naproxen, Excedrin without relief.  Vision is blurred momentarily when the pain gets very sharp.  She denies nausea or vomiting and denies weakness or numbness or tingling.  She is very concerned that she may have a brain tumor.   Home Medications Prior to Admission medications   Medication Sig Start Date End Date Taking? Authorizing Provider  metroNIDAZOLE (FLAGYL) 500 MG tablet Take 1 tablet (500 mg total) by mouth 2 (two) times daily. One po bid x 7 days 05/15/19   Bethann Berkshire, MD  ondansetron (ZOFRAN) 4 MG tablet Take 1 tablet (4 mg total) by mouth every 8 (eight) hours as needed. 06/16/17   Devoria Albe, MD  potassium chloride SA (K-DUR,KLOR-CON) 20 MEQ tablet Take 1 tablet (20 mEq total) by mouth 2 (two) times daily. 06/16/17   Devoria Albe, MD      Allergies    Augmentin [amoxicillin-pot clavulanate] and Other    Review of Systems   Review of Systems  Neurological:  Positive for dizziness and headaches.  All other systems reviewed and are negative.   Physical Exam Updated Vital Signs BP 90/75   Pulse 88   Temp 98.8 F (37.1 C) (Oral)   Resp 18   Ht 5\' 5"  (1.651 m)   Wt 89.8 kg   SpO2 96%   BMI 32.95 kg/m  Physical Exam Vitals and nursing note  reviewed.   36 year old female, resting comfortably and in no acute distress. Vital signs are normal. Oxygen saturation is 96%, which is normal. Head is normocephalic and atraumatic. PERRLA, EOMI. Oropharynx is clear.  There is tenderness palpation over the temporalis muscles bilaterally and over the insertion of the paracervical muscles bilaterally. Neck is nontender and supple without adenopathy or JVD. Back is nontender and there is no CVA tenderness. Lungs are clear without rales, wheezes, or rhonchi. Chest is nontender. Heart has regular rate and rhythm without murmur. Abdomen is soft, flat, nontender. Extremities have no cyanosis or edema, full range of motion is present. Skin is warm and dry without rash. Neurologic: Mental status is normal, cranial nerves are intact, strength is 5/5 in all 4 extremities, there is no pronator drift, sensation is normal and there is no extinction on double simultaneous stimulation.  ED Results / Procedures / Treatments   Labs (all labs ordered are listed, but only abnormal results are displayed) Labs Reviewed  BASIC METABOLIC PANEL - Abnormal; Notable for the following components:      Result Value   CO2 20 (*)    Creatinine, Ser 1.06 (*)    All other components within normal limits  CBC WITH DIFFERENTIAL/PLATELET - Abnormal; Notable for the following components:   MCV 100.5 (*)    All other  components within normal limits  URINALYSIS, ROUTINE W REFLEX MICROSCOPIC - Abnormal; Notable for the following components:   APPearance HAZY (*)    All other components within normal limits  PREGNANCY, URINE    EKG None  Radiology CT Head Wo Contrast  Result Date: 01/16/2022 CLINICAL DATA:  Headache EXAM: CT HEAD WITHOUT CONTRAST TECHNIQUE: Contiguous axial images were obtained from the base of the skull through the vertex without intravenous contrast. RADIATION DOSE REDUCTION: This exam was performed according to the departmental dose-optimization  program which includes automated exposure control, adjustment of the mA and/or kV according to patient size and/or use of iterative reconstruction technique. COMPARISON:  None Available. FINDINGS: Brain: No evidence of acute infarction, hemorrhage, hydrocephalus, extra-axial collection or mass lesion/mass effect. Vascular: No hyperdense vessel or unexpected calcification. Skull: Normal. Negative for fracture or focal lesion. Sinuses/Orbits: No acute finding. Other: None. IMPRESSION: No acute intracranial abnormality. Electronically Signed   By: Darliss Cheney M.D.   On: 01/16/2022 16:56    Procedures Procedures  {Document cardiac monitor, telemetry assessment procedure when appropriate:1}  Medications Ordered in ED Medications - No data to display  ED Course/ Medical Decision Making/ A&P                           Medical Decision Making Amount and/or Complexity of Data Reviewed Labs: ordered. Radiology: ordered.   Headache which seems most likely to be muscle contraction headache.  Pattern is very atypical for migraine.  Doubt subarachnoid hemorrhage, intracranial tumor, polymyalgia rheumatica, temporal arteritis.  CT of the head was ordered at triage and is normal.  Have independently viewed the images, and agree with radiologist interpretation.  I have reviewed and interpreted her laboratory test, and my interpretation is borderline metabolic acidosis and borderline elevated creatinine which are not felt to be clinically significant, borderline elevated MCV which is not felt to be clinically significant, otherwise normal CBC and basic metabolic panel.  Normal urinalysis.  Patient is not pregnant.  I have ordered a headache cocktail of normal saline solution, prochlorperazine, diphenhydramine, ketorolac, dexamethasone.    Final Clinical Impression(s) / ED Diagnoses Final diagnoses:  None    Rx / DC Orders ED Discharge Orders     None

## 2022-01-17 NOTE — Discharge Instructions (Signed)
If your headache comes back, try applying ice to the areas in the back of your neck and on your temples which seem to be trigger areas.  You may take ibuprofen or naproxen as needed for pain.  If you need additional pain relief, add acetaminophen.  When you combine acetaminophen with either ibuprofen or naproxen, you get better pain relief that you get from taking either medication by itself.

## 2022-06-24 ENCOUNTER — Encounter (HOSPITAL_COMMUNITY): Payer: Self-pay | Admitting: *Deleted

## 2022-06-24 ENCOUNTER — Other Ambulatory Visit: Payer: Self-pay

## 2022-06-24 ENCOUNTER — Emergency Department (HOSPITAL_COMMUNITY)
Admission: EM | Admit: 2022-06-24 | Discharge: 2022-06-24 | Discharge status: Against Medical Advice | Payer: Medicaid Other | Attending: Emergency Medicine | Admitting: Emergency Medicine

## 2022-06-24 DIAGNOSIS — R519 Headache, unspecified: Secondary | ICD-10-CM | POA: Diagnosis not present

## 2022-06-24 DIAGNOSIS — Z5329 Procedure and treatment not carried out because of patient's decision for other reasons: Secondary | ICD-10-CM | POA: Diagnosis not present

## 2022-06-24 MED ORDER — DIPHENHYDRAMINE HCL 50 MG/ML IJ SOLN
25.0000 mg | Freq: Once | INTRAMUSCULAR | Status: AC
  Administered 2022-06-24: 25 mg via INTRAVENOUS
  Filled 2022-06-24: qty 1

## 2022-06-24 MED ORDER — PROCHLORPERAZINE EDISYLATE 10 MG/2ML IJ SOLN
10.0000 mg | Freq: Once | INTRAMUSCULAR | Status: AC
  Administered 2022-06-24: 10 mg via INTRAVENOUS
  Filled 2022-06-24: qty 2

## 2022-06-24 MED ORDER — SODIUM CHLORIDE 0.9 % IV BOLUS
1000.0000 mL | Freq: Once | INTRAVENOUS | Status: AC
  Administered 2022-06-24: 1000 mL via INTRAVENOUS

## 2022-06-24 MED ORDER — DEXAMETHASONE SODIUM PHOSPHATE 10 MG/ML IJ SOLN
10.0000 mg | Freq: Once | INTRAMUSCULAR | Status: AC
  Administered 2022-06-24: 10 mg via INTRAVENOUS
  Filled 2022-06-24: qty 1

## 2022-06-24 NOTE — ED Notes (Signed)
Pt not able to stay for her MRI due to needing to pick up her son from school.  EDP made aware and spoke with pt .

## 2022-06-24 NOTE — ED Triage Notes (Signed)
Pt c/o headache x 3 days ago; pt having sensitivity to light with nausea

## 2022-06-24 NOTE — ED Provider Triage Note (Cosign Needed)
Emergency Medicine Provider Triage Evaluation Note  Annette Morton , a 37 y.o. female  was evaluated in triage.  Pt complains of migraine headache and photophobia, nausea without vomiting, started 3 days ago.  Consistent with prior migraine headache..  Review of Systems  Positive: Headache, nausea and vomiting, photophobia Negative: Fever, focal weakness  Physical Exam  BP 135/82 (BP Location: Right Arm)   Pulse 85   Temp 98.6 F (37 C) (Oral)   Resp 18   Ht 5\' 6"  (1.676 m)   Wt 87.5 kg   SpO2 96%   BMI 31.15 kg/m  Gen:   Awake, no distress   Resp:  Normal effort  MSK:   Moves extremities without difficulty  Other:    Medical Decision Making  Medically screening exam initiated at 12:29 PM.  Appropriate orders placed.  Annette Morton was informed that the remainder of the evaluation will be completed by another provider, this initial triage assessment does not replace that evaluation, and the importance of remaining in the ED until their evaluation is complete.     Burgess Amor, PA-C 06/24/22 1229

## 2022-06-24 NOTE — ED Provider Notes (Signed)
EMERGENCY DEPARTMENT AT [facility] Provider Note   CSN: [id number] Arrival date & time: 06/24/22  1009     History  Chief Complaint  Patient presents with   Migraine    Annette Morton is a 37 y.o. female. 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  She presents the ER today complaining of headache x 3 days.  She reports history of headaches in the past but states they usually go away with over-the-counter medications such as BC powder.  States this headache has been more severe and more persistent, described as all over with feeling of tightness in the back of her neck.  Denies any injury or trauma.  She reports extreme nausea but has not vomited.  Denies fevers or chills or neck stiffness.  Denies blurry vision, numbness tingling or weakness.  She was seen in December in the ED for headache as well, she states it was similar today but not exactly the same.  She had a CT scan at that time that was normal.  Continue to have some intermittent headaches but nothing as severe as today's.  Migraine       Home Medications Prior to Admission medications   Not on File      Allergies    Augmentin [amoxicillin-pot clavulanate] and Other    Review of Systems   Review of Systems  Physical Exam Updated Vital Signs BP 135/82 (BP Location: Right Arm)   Pulse 85   Temp 98.6 F (37 C) (Oral)   Resp 18   Ht 5\' 6"  (1.676 m)   Wt 87.5 kg   SpO2 96%   BMI 31.15 kg/m  Physical Exam Vitals and nursing note reviewed.  Constitutional:      General: She is not in acute distress.    Appearance: She is well-developed.  HENT:     Head: Normocephalic and atraumatic.     Mouth/Throat:     Mouth: Mucous membranes are moist.  Eyes:     General: No visual field deficit.    Conjunctiva/sclera: Conjunctivae normal.  Cardiovascular:     Rate and Rhythm: Normal rate and regular rhythm.     Heart sounds: No  murmur heard. Pulmonary:     Effort: Pulmonary effort is normal. No respiratory distress.     Breath sounds: Normal breath sounds.  Abdominal:     Palpations: Abdomen is soft.     Tenderness: There is no abdominal tenderness.  Musculoskeletal:        General: No swelling.     Cervical back: Neck supple.  Skin:    General: Skin is warm and dry.     Capillary Refill: Capillary refill takes less than 2 seconds.  Neurological:     General: No focal deficit present.     Mental Status: She is alert and oriented to person, place, and time.     GCS: GCS eye subscore is 4. GCS verbal subscore is 5. GCS motor subscore is 6.     Cranial Nerves: No dysarthria or facial asymmetry.     Sensory: No sensory deficit.     Motor: No weakness, tremor, seizure activity or pronator drift.     Coordination: Coordination normal. Finger-Nose-Finger Test normal.     Gait: Gait normal.  Psychiatric:        Mood and Affect: Mood normal.     ED Results / Procedures / Treatments   Labs (all  labs ordered are listed, but only abnormal results are displayed) Labs Reviewed  PREGNANCY, URINE    EKG None  Radiology No results found.  Procedures Procedures    Medications Ordered in ED Medications  prochlorperazine (COMPAZINE) injection 10 mg (has no administration in time range)  diphenhydrAMINE (BENADRYL) injection 25 mg (has no administration in time range)  dexamethasone (DECADRON) injection 10 mg (has no administration in time range)  sodium chloride 0.9 % bolus 1,000 mL (has no administration in time range)    ED Course/ Medical Decision Making/ A&P                             Medical Decision Making This patient presents to the ED for concern of headache X 3 days, this involves an extensive number of treatment options, and is a complaint that carries with it a high risk of complications and morbidity.  The differential diagnosis includes migraine, tension headache, sinusitis, venous sinus  thrombosis, SAH, pseudotumor cerebri, other   Co morbidities that complicate the patient evaluation  Hx of PE   Additional history obtained:  Additional history obtained from EMR External records from outside source obtained and reviewed including prior GYN notes, recent CT report from December 2023 for similar headache      Problem List / ED Course / Critical interventions / Medication management  Headache, history of headaches but today is worse and more persistent.  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  Overall well-appearing with normal neurologic exam.  Discussed could order venogram but patient had to leave to go pick up her son.  She understands risk, discussed about risk of morbidity mortality, she understands.  She is invited to come back if she changes her mind   Amount and/or Complexity of Data Reviewed Labs: ordered. Radiology: ordered.  Risk Prescription drug management.           Final Clinical Impression(s) / ED Diagnoses Final diagnoses:  None    Rx / DC Orders ED Discharge Orders     None         Josem Kaufmann 06/24/22 1515    Lonell Grandchild, MD 06/24/22 1844

## 2022-06-24 NOTE — Discharge Instructions (Signed)
You were seen today before we were able to complete your testing. If you are not able/willing to come back please follow up with your PCP and/or neurology for you headaches.

## 2022-09-05 DIAGNOSIS — N898 Other specified noninflammatory disorders of vagina: Secondary | ICD-10-CM | POA: Diagnosis not present

## 2022-11-09 ENCOUNTER — Emergency Department (HOSPITAL_COMMUNITY)
Admission: EM | Admit: 2022-11-09 | Discharge: 2022-11-10 | Disposition: A | Discharge status: Home / Self Care | Payer: Medicaid Other | Attending: Emergency Medicine | Admitting: Emergency Medicine

## 2022-11-09 ENCOUNTER — Encounter (HOSPITAL_COMMUNITY): Payer: Self-pay | Admitting: Emergency Medicine

## 2022-11-09 DIAGNOSIS — R2240 Localized swelling, mass and lump, unspecified lower limb: Secondary | ICD-10-CM | POA: Diagnosis not present

## 2022-11-09 DIAGNOSIS — M7989 Other specified soft tissue disorders: Secondary | ICD-10-CM | POA: Diagnosis not present

## 2022-11-09 DIAGNOSIS — Z7901 Long term (current) use of anticoagulants: Secondary | ICD-10-CM | POA: Insufficient documentation

## 2022-11-09 DIAGNOSIS — R2241 Localized swelling, mass and lump, right lower limb: Secondary | ICD-10-CM | POA: Diagnosis present

## 2022-11-09 DIAGNOSIS — R202 Paresthesia of skin: Secondary | ICD-10-CM | POA: Diagnosis not present

## 2022-11-09 MED ORDER — GABAPENTIN 100 MG PO CAPS
100.0000 mg | ORAL_CAPSULE | Freq: Once | ORAL | Status: AC
  Administered 2022-11-10: 100 mg via ORAL
  Filled 2022-11-09: qty 1

## 2022-11-09 MED ORDER — APIXABAN 5 MG PO TABS
10.0000 mg | ORAL_TABLET | Freq: Once | ORAL | Status: AC
  Administered 2022-11-10: 10 mg via ORAL
  Filled 2022-11-09: qty 2

## 2022-11-09 NOTE — ED Triage Notes (Signed)
Pt states around 3pm started to have right knee pain, then a little while later right foot started tingling, now patient states pain and numbness from right knee all the way down to right ankle. Pt want to make sure she does nto have a blood clot as she has a history of blood clots. Denies any injury.

## 2022-11-10 ENCOUNTER — Ambulatory Visit (HOSPITAL_COMMUNITY)
Admission: RE | Admit: 2022-11-10 | Discharge: 2022-11-10 | Disposition: A | Discharge status: Home / Self Care | Payer: Medicaid Other | Source: Ambulatory Visit | Attending: Emergency Medicine | Admitting: Emergency Medicine

## 2022-11-10 DIAGNOSIS — M7989 Other specified soft tissue disorders: Secondary | ICD-10-CM | POA: Insufficient documentation

## 2022-11-10 DIAGNOSIS — R600 Localized edema: Secondary | ICD-10-CM | POA: Diagnosis not present

## 2022-11-10 DIAGNOSIS — M79604 Pain in right leg: Secondary | ICD-10-CM | POA: Diagnosis not present

## 2022-11-10 MED ORDER — GABAPENTIN 100 MG PO CAPS: 100.0000 mg | ORAL_CAPSULE | Freq: Three times a day (TID) | ORAL | 0 refills | Status: AC

## 2022-11-10 MED ORDER — APIXABAN (ELIQUIS) VTE STARTER PACK (10MG AND 5MG): ORAL_TABLET | ORAL | 0 refills | Status: AC

## 2022-11-10 NOTE — Discharge Instructions (Addendum)
Number to schedule your ultrasound for DVT study in the morning.  I have sent a prescription for Neurontin to your pharmacy you can take this either way.  If you have a blood clot then go ahead and get the blood thinners filled.  If you do not have a blood clot on the ultrasound then you do not need those.  Either way need to follow-up with your primary doctor either to refill the blood thinners or to further workup the manage the paresthesias and swelling in your leg.

## 2022-11-11 NOTE — ED Provider Notes (Signed)
[facility] EMERGENCY DEPARTMENT AT [facility] Provider Note   CSN: [id number] Arrival date & time: 11/09/22  2010     History  Chief Complaint  Patient presents with   Knee Pain   Leg Pain    Annette Morton is a 36 y.o. female.  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 Had never been evaluated for protein c/s or other blood disorders to her knowledge or in review of records in epic. She states that she started having swelling around the right knee earlier in day on Wednesday. Progressed to paresthesias and edema in lower tib/fib area on that side. No severe pain. No redness. No injury. Now having worse pain with walking. This feels similar to how her arm did with the DVT then.    Knee Pain Leg Pain      Home Medications Prior to Admission medications   Medication Sig Start Date End Date Taking? Authorizing Provider  APIXABAN (ELIQUIS) VTE STARTER PACK (10MG  AND 5MG ) Take as directed on package: start with two-5mg  tablets twice daily for 7 days. On day 8, switch to one-5mg  tablet twice daily. 11/10/22  Yes Jamian Andujo, Barbara Cower, MD  gabapentin (NEURONTIN) 100 MG capsule Take 1 capsule (100 mg total) by mouth 3 (three) times daily for 14 days. 11/10/22 11/24/22 Yes Fani Rotondo, Barbara Cower, MD      Allergies    Augmentin [amoxicillin-pot clavulanate] and Other    Review of Systems   Review of Systems  Physical Exam Updated Vital Signs BP 121/86   Pulse 71   Temp 98.4 F (36.9 C) (Oral)   Resp 18   Ht 5\' 6"  (1.676 m)   Wt 93 kg   SpO2 97%   BMI 33.09 kg/m  Physical Exam Vitals and nursing note reviewed.  Constitutional:      Appearance: She is well-developed.  HENT:     Head: Normocephalic and atraumatic.  Cardiovascular:     Rate and Rhythm: Normal rate and regular rhythm.     Comments: Intact PT/DP pulse on bilateral feet Pulmonary:      Effort: No respiratory distress.     Breath sounds: No stridor.  Abdominal:     General: There is no distension.  Musculoskeletal:        General: Swelling present. No tenderness.     Cervical back: Normal range of motion.     Right lower leg: Edema present.     Left lower leg: No edema.  Skin:    General: Skin is warm and dry.     Findings: No erythema.     Comments: No obvious discoloration  Neurological:     General: No focal deficit present.     Mental Status: She is alert.     Comments: No motor changes in lower extremites, no sensation changes.      ED Results / Procedures / Treatments   Labs (all labs ordered are listed, but only abnormal results are displayed) Labs Reviewed - No data to display  EKG None  Radiology   Procedures Procedures    Medications Ordered in ED Medications  gabapentin (NEURONTIN) capsule 100 mg (100 mg Oral Given 11/10/22 0023)  apixaban (ELIQUIS) tablet 10 mg (10 mg Oral Given 11/10/22 0023)    ED Course/ Medical Decision Making/ A&P  Medical Decision Making Risk Prescription drug management.   Could be DVT. No longer on anticoagulation. Since she has edema and history of same, will dose tonight and write rx for eliquis if positive. She knows not to start at home unles confirmed positive. Will also give ace wrap, crutches and needs to fu w/ PCP either way if positive or negative. Since pain seems like paresthesia will try neurontin for discomfort. No e/o spinal nerve compression at this time this would seem to be more peripheral in nature if truly the cause of symptoms and can be worked up by PCP if DVT study negative.   Final Clinical Impression(s) / ED Diagnoses Final diagnoses:  Leg swelling  Paresthesia of foot    Rx / DC Orders ED Discharge Orders          Ordered    US Venous Img Lower Unilateral Right  Status:  Canceled        11/10/22 0004    gabapentin (NEURONTIN) 100 MG capsule  3  times daily        11/10/22 0006    APIXABAN (ELIQUIS) VTE STARTER PACK (10MG  AND 5MG )       Note to Pharmacy: If starter pack unavailable, substitute with seventy-four 5 mg apixaban tabs following the above SIG directions.   11/10/22 0006    US Venous Img Lower Unilateral Right (DVT)        11/10/22 0004              Neely Kammerer, Barbara Cower, MD 11/11/22 1610
